# Patient Record
Sex: Male | Born: 1980 | Race: Black or African American | Hispanic: No | Marital: Single | Smoking: Never smoker
Health system: Southern US, Community
[De-identification: ages and names within clinical notes are randomized; demographics above are authoritative.]

## PROBLEM LIST (undated history)

## (undated) DIAGNOSIS — N200 Calculus of kidney: Secondary | ICD-10-CM

## (undated) HISTORY — PX: LITHOTRIPSY: SUR834

## (undated) HISTORY — PX: DENTAL SURGERY: SHX609

---

## 2007-11-18 ENCOUNTER — Ambulatory Visit: Payer: Self-pay | Admitting: Family Medicine

## 2007-11-18 LAB — CONVERTED CEMR LAB
ALT: 12 units/L (ref 0–53)
Albumin: 4.8 g/dL (ref 3.5–5.2)
BUN: 7 mg/dL (ref 6–23)
Basophils Absolute: 0 10*3/uL (ref 0.0–0.1)
Basophils Relative: 1 % (ref 0–1)
CO2: 24 meq/L (ref 19–32)
Eosinophils Absolute: 0.2 10*3/uL (ref 0.0–0.7)
Eosinophils Relative: 4 % (ref 0–5)
HCT: 41.4 % (ref 39.0–52.0)
HDL: 69 mg/dL (ref >39)
Hemoglobin: 13.6 g/dL (ref 13.0–17.0)
LDL Cholesterol: 63 mg/dL (ref 0–99)
Lymphocytes Relative: 38 % (ref 12–46)
Lymphs Abs: 1.6 10*3/uL (ref 0.7–4.0)
MCV: 79.9 fL (ref 78.0–100.0)
Monocytes Absolute: 0.3 10*3/uL (ref 0.1–1.0)
Monocytes Relative: 7 % (ref 3–12)
Platelets: 270 10*3/uL (ref 150–400)
Potassium: 4.3 meq/L (ref 3.5–5.3)
RDW: 13.7 % (ref 11.5–15.5)
Total CHOL/HDL Ratio: 2.1
Total Protein: 7.7 g/dL (ref 6.0–8.3)

## 2009-09-11 ENCOUNTER — Emergency Department (HOSPITAL_COMMUNITY): Admission: EM | Admit: 2009-09-11 | Discharge: 2009-09-11 | Payer: Self-pay | Admitting: Emergency Medicine

## 2009-09-28 ENCOUNTER — Emergency Department (HOSPITAL_COMMUNITY): Admission: EM | Admit: 2009-09-28 | Discharge: 2009-09-29 | Payer: Self-pay | Admitting: Emergency Medicine

## 2009-10-01 ENCOUNTER — Emergency Department (HOSPITAL_COMMUNITY): Admission: EM | Admit: 2009-10-01 | Discharge: 2009-10-01 | Payer: Self-pay | Admitting: Emergency Medicine

## 2009-10-11 ENCOUNTER — Ambulatory Visit: Payer: Self-pay | Admitting: Family Medicine

## 2010-06-30 LAB — COMPREHENSIVE METABOLIC PANEL
AST: 22 U/L (ref 0–37)
Calcium: 9.6 mg/dL (ref 8.4–10.5)
Chloride: 99 mEq/L (ref 96–112)
GFR calc Af Amer: >60 mL/min (ref >60)
Total Bilirubin: 0.9 mg/dL (ref 0.3–1.2)
Total Protein: 8.1 g/dL (ref 6.0–8.3)

## 2010-06-30 LAB — URINALYSIS, ROUTINE W REFLEX MICROSCOPIC
Glucose, UA: NEGATIVE mg/dL
Ketones, ur: NEGATIVE mg/dL
pH: 7 (ref 5.0–8.0)

## 2010-06-30 LAB — DIFFERENTIAL
Basophils Absolute: 0 10*3/uL (ref 0.0–0.1)
Basophils Relative: 1 % (ref 0–1)
Monocytes Absolute: 0.7 10*3/uL (ref 0.1–1.0)
Monocytes Relative: 7 % (ref 3–12)
Neutro Abs: 7.6 10*3/uL (ref 1.7–7.7)

## 2010-06-30 LAB — CBC
HCT: 41.1 % (ref 39.0–52.0)
MCV: 81.2 fL (ref 78.0–100.0)
RBC: 5.06 MIL/uL (ref 4.22–5.81)
RDW: 13.2 % (ref 11.5–15.5)
WBC: 9.4 10*3/uL (ref 4.0–10.5)

## 2010-06-30 LAB — RAPID STREP SCREEN (MED CTR MEBANE ONLY)
Streptococcus, Group A Screen (Direct): NEGATIVE
Streptococcus, Group A Screen (Direct): NEGATIVE

## 2011-02-13 IMAGING — CR DG CHEST 2V
2 series · 2 of 2 positions shown · non-contrast
Comparison: None

CLINICAL DATA: Fever; recent strep throat.

CHEST - 2 VIEW

[w chest pa]
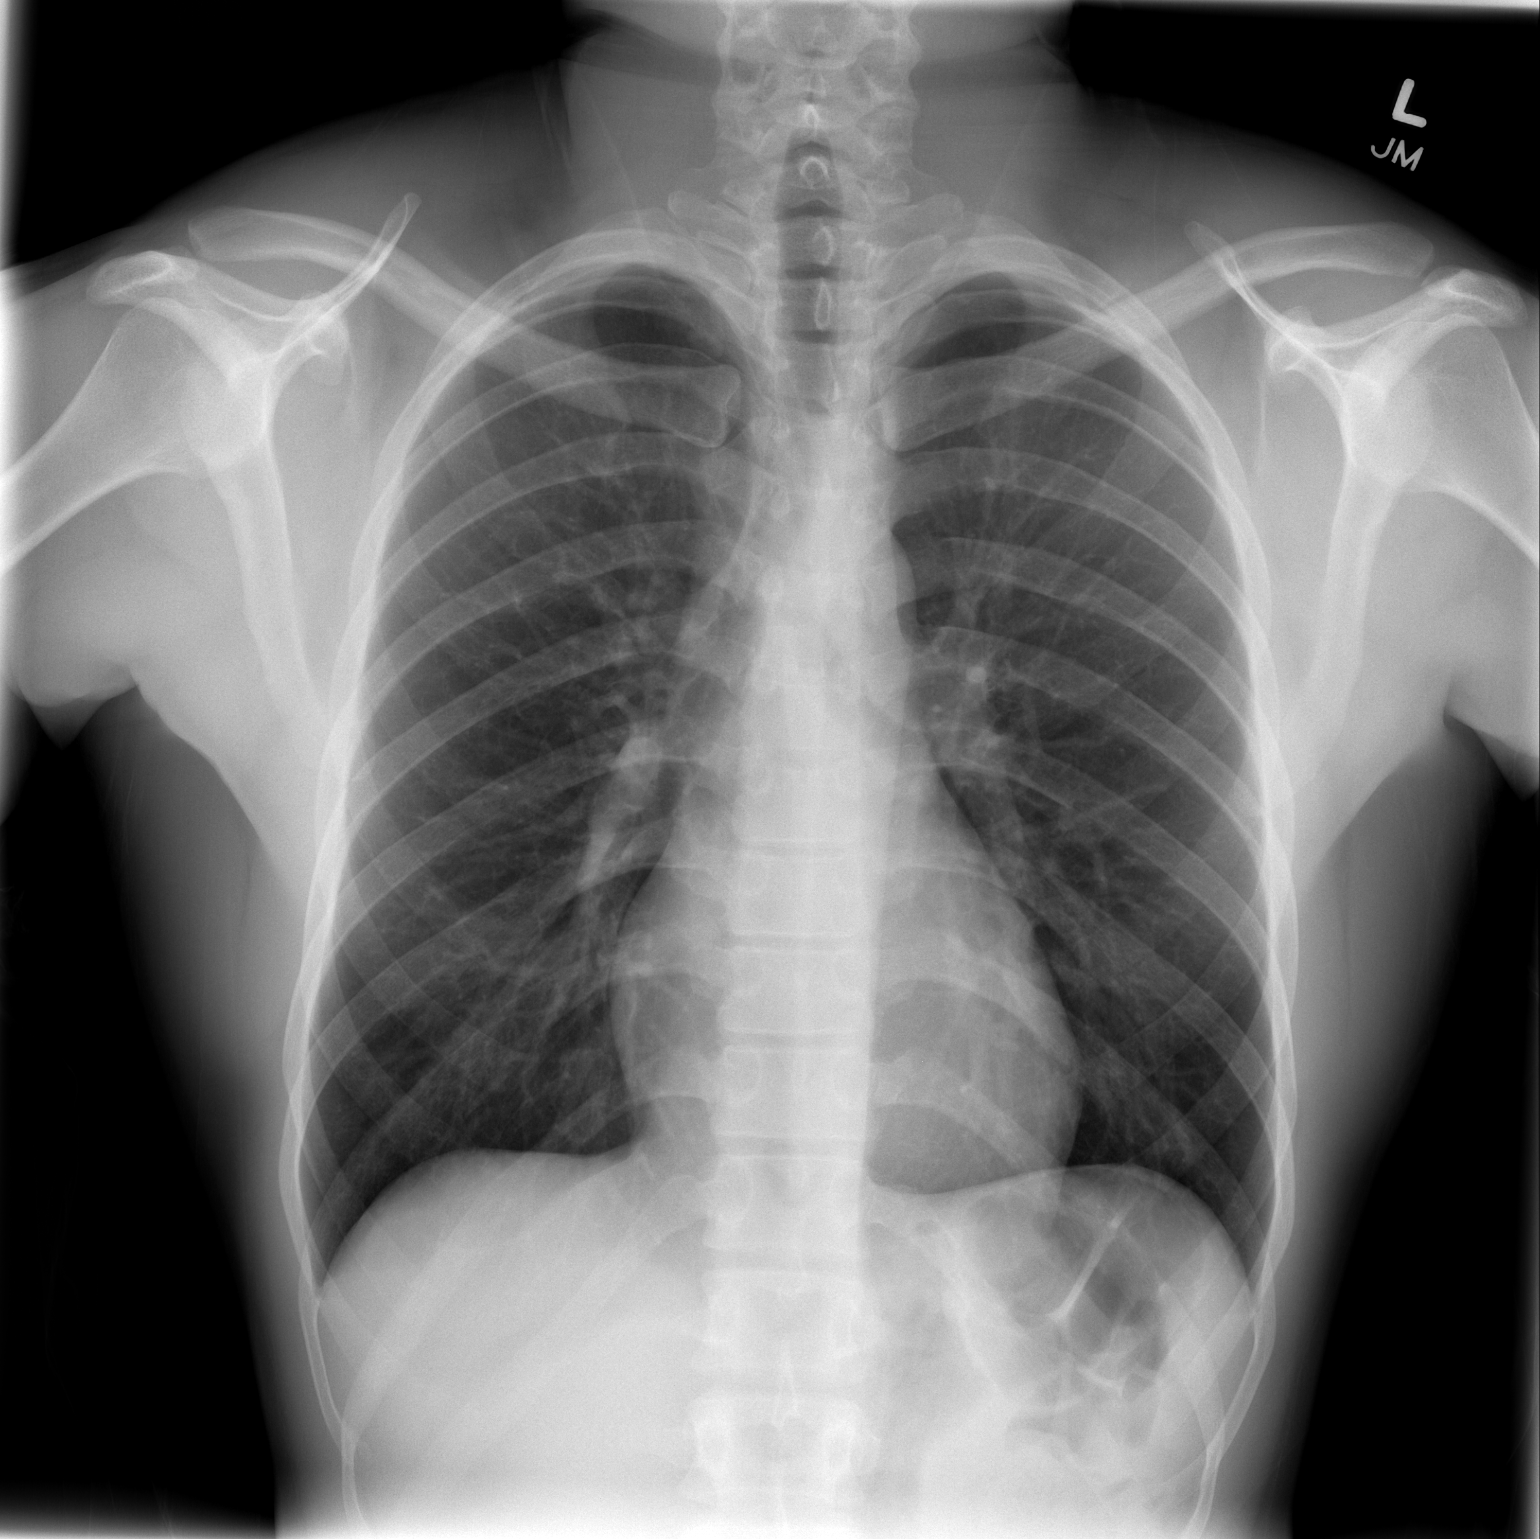

[w chest lat]
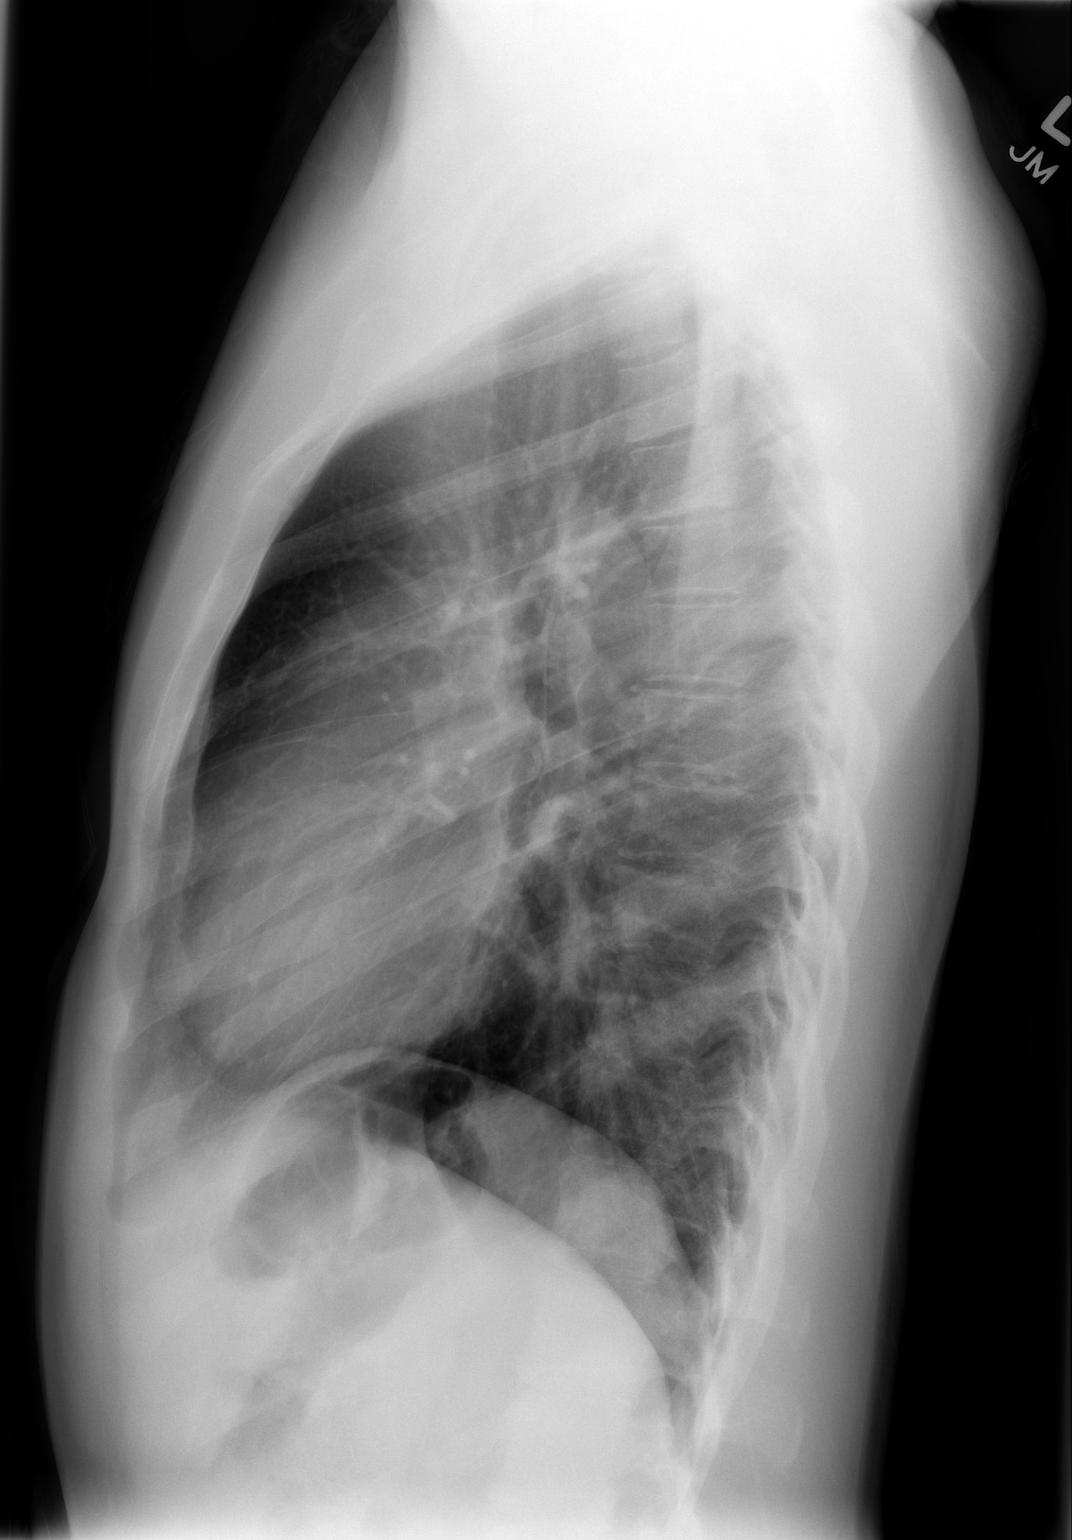

[2 of 2 positions shown; findings below may reference images not displayed]

FINDINGS: The lungs are well-aerated and clear.  There is no
evidence of focal opacification, pleural effusion or pneumothorax.

The heart is normal in size; the mediastinal contour is within
normal limits.  No acute osseous abnormalities are seen.
IMPRESSION: No acute cardiopulmonary process seen.

## 2011-04-03 ENCOUNTER — Ambulatory Visit: Payer: Self-pay

## 2011-05-18 ENCOUNTER — Emergency Department (HOSPITAL_COMMUNITY)
Admission: EM | Admit: 2011-05-18 | Discharge: 2011-05-18 | Disposition: A | Discharge status: Home / Self Care | Payer: Self-pay | Source: Home / Self Care | Attending: Family Medicine | Admitting: Family Medicine

## 2011-05-18 ENCOUNTER — Encounter (HOSPITAL_COMMUNITY): Payer: Self-pay

## 2011-05-18 DIAGNOSIS — M25579 Pain in unspecified ankle and joints of unspecified foot: Secondary | ICD-10-CM

## 2011-05-18 DIAGNOSIS — M25572 Pain in left ankle and joints of left foot: Secondary | ICD-10-CM

## 2011-05-18 NOTE — ED Provider Notes (Signed)
History     CSN: [id number]  Arrival date & time 05/18/11  1058   First MD Initiated Contact with Patient 05/18/11 1106      Chief Complaint  Patient presents with  . Foot Pain    (Consider location/radiation/quality/duration/timing/severity/associated sxs/prior treatment) HPI Comments: Alexander Ayala presents for evaluation of left ankle pain. He states that the pain started last Monday while he was at work. He denies any specific injury. 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 Reports worsening pain throughout the week. He does continue to walk on it and work on it.  Patient is a 31 y.o. male presenting with ankle pain. The history is provided by the patient.  Ankle Pain  The incident occurred more than 2 days ago. The incident occurred at work. There was no injury mechanism. The pain is present in the left ankle. The quality of the pain is described as aching. The pain is mild. Pertinent negatives include no numbness, no inability to bear weight and no tingling. The symptoms are aggravated by activity and bearing weight.    History reviewed. No pertinent past medical history.  History reviewed. No pertinent past surgical history.  History reviewed. No pertinent family history.  History  Substance Use Topics  . Smoking status: Never Smoker   . Smokeless tobacco: Not on file  . Alcohol Use: No      Review of Systems  Constitutional: Negative.   HENT: Negative.   Eyes: Negative.   Respiratory: Negative.   Cardiovascular: Negative.   Gastrointestinal: Negative.   Genitourinary: Negative.   Musculoskeletal: Positive for myalgias and arthralgias.       LEFT ankle pain  Skin: Negative.   Neurological: Negative.  Negative for tingling and numbness.    Allergies  Review of patient's allergies indicates no known allergies.  Home Medications  No current outpatient prescriptions on file.  BP 148/86  Pulse 88  Temp 98.2 F (36.8 C)  Resp  18  Physical Exam  Nursing note and vitals reviewed. Constitutional: He is oriented to person, place, and time. He appears well-developed and well-nourished.  HENT:  Head: Normocephalic and atraumatic.  Eyes: EOM are normal.  Neck: Normal range of motion.  Pulmonary/Chest: Effort normal.  Musculoskeletal: Normal range of motion.       Left ankle: He exhibits normal range of motion, no swelling, no ecchymosis and no deformity. no tenderness. Achilles tendon normal.  Neurological: He is alert and oriented to person, place, and time.  Skin: Skin is warm and dry.  Psychiatric: His behavior is normal.    ED Course  Procedures (including critical care time)  Labs Reviewed - No data to display No results found.   1. Ankle pain, left       MDM  Placed in ASO, given work note        Richardo Priest, MD 05/18/11 1204

## 2011-05-18 NOTE — ED Notes (Signed)
Pt states he has lt foot pain that started on Monday at work, no known injury and no edema noted.

## 2014-05-01 ENCOUNTER — Emergency Department (HOSPITAL_BASED_OUTPATIENT_CLINIC_OR_DEPARTMENT_OTHER): Payer: Self-pay

## 2014-05-01 ENCOUNTER — Emergency Department (HOSPITAL_BASED_OUTPATIENT_CLINIC_OR_DEPARTMENT_OTHER)
Admission: EM | Admit: 2014-05-01 | Discharge: 2014-05-01 | Disposition: A | Discharge status: Home / Self Care | Payer: Self-pay | Attending: Emergency Medicine | Admitting: Emergency Medicine

## 2014-05-01 ENCOUNTER — Encounter (HOSPITAL_BASED_OUTPATIENT_CLINIC_OR_DEPARTMENT_OTHER): Payer: Self-pay | Admitting: *Deleted

## 2014-05-01 DIAGNOSIS — N201 Calculus of ureter: Secondary | ICD-10-CM | POA: Insufficient documentation

## 2014-05-01 DIAGNOSIS — R52 Pain, unspecified: Secondary | ICD-10-CM

## 2014-05-01 LAB — BASIC METABOLIC PANEL
ANION GAP: 6 (ref 5–15)
BUN: 12 mg/dL (ref 6–23)
CALCIUM: 8.7 mg/dL (ref 8.4–10.5)
CO2: 25 mmol/L (ref 19–32)
Chloride: 100 mEq/L (ref 96–112)
Creatinine, Ser: 1.21 mg/dL (ref 0.50–1.35)
GFR calc Af Amer: >90 mL/min (ref >90)
GFR, EST NON AFRICAN AMERICAN: 78 mL/min — AB (ref >90)
GLUCOSE: 119 mg/dL — AB (ref 70–99)
POTASSIUM: 4.1 mmol/L (ref 3.5–5.1)
SODIUM: 131 mmol/L — AB (ref 135–145)

## 2014-05-01 LAB — CBC WITH DIFFERENTIAL/PLATELET
BASOS PCT: 0 % (ref 0–1)
Basophils Absolute: 0 10*3/uL (ref 0.0–0.1)
EOS ABS: 0 10*3/uL (ref 0.0–0.7)
EOS PCT: 0 % (ref 0–5)
HCT: 41.8 % (ref 39.0–52.0)
Hemoglobin: 14 g/dL (ref 13.0–17.0)
LYMPHS ABS: 0.9 10*3/uL (ref 0.7–4.0)
Lymphocytes Relative: 8 % — ABNORMAL LOW (ref 12–46)
MCH: 26.2 pg (ref 26.0–34.0)
MCHC: 33.5 g/dL (ref 30.0–36.0)
MCV: 78.1 fL (ref 78.0–100.0)
MONOS PCT: 7 % (ref 3–12)
Monocytes Absolute: 0.8 10*3/uL (ref 0.1–1.0)
NEUTROS PCT: 85 % — AB (ref 43–77)
Neutro Abs: 9.6 10*3/uL — ABNORMAL HIGH (ref 1.7–7.7)
Platelets: 270 10*3/uL (ref 150–400)
RBC: 5.35 MIL/uL (ref 4.22–5.81)
RDW: 13.6 % (ref 11.5–15.5)
WBC: 11.3 10*3/uL — ABNORMAL HIGH (ref 4.0–10.5)

## 2014-05-01 LAB — URINALYSIS, ROUTINE W REFLEX MICROSCOPIC
BILIRUBIN URINE: NEGATIVE
Glucose, UA: NEGATIVE mg/dL
KETONES UR: 40 mg/dL — AB
NITRITE: NEGATIVE
PH: 6 (ref 5.0–8.0)
PROTEIN: NEGATIVE mg/dL
SPECIFIC GRAVITY, URINE: 1.024 (ref 1.005–1.030)
UROBILINOGEN UA: 0.2 mg/dL (ref 0.0–1.0)

## 2014-05-01 LAB — URINE MICROSCOPIC-ADD ON

## 2014-05-01 MED ORDER — CIPROFLOXACIN HCL 500 MG PO TABS: 500.0000 mg | ORAL_TABLET | Freq: Two times a day (BID) | ORAL | Status: DC

## 2014-05-01 MED ORDER — SODIUM CHLORIDE 0.9 % IV BOLUS (SEPSIS)
1000.0000 mL | Freq: Once | INTRAVENOUS | Status: AC
  Administered 2014-05-01: 1000 mL via INTRAVENOUS

## 2014-05-01 MED ORDER — PROMETHAZINE HCL 25 MG PO TABS: 25.0000 mg | ORAL_TABLET | Freq: Four times a day (QID) | ORAL | Status: DC | PRN

## 2014-05-01 MED ORDER — TAMSULOSIN HCL 0.4 MG PO CAPS: 0.4000 mg | ORAL_CAPSULE | Freq: Every day | ORAL | Status: DC

## 2014-05-01 MED ORDER — TAMSULOSIN HCL 0.4 MG PO CAPS
0.4000 mg | ORAL_CAPSULE | Freq: Once | ORAL | Status: AC
  Administered 2014-05-01: 0.4 mg via ORAL
  Filled 2014-05-01: qty 1

## 2014-05-01 MED ORDER — HYDROMORPHONE HCL 4 MG PO TABS: 2.0000 mg | ORAL_TABLET | ORAL | Status: DC | PRN

## 2014-05-01 MED ORDER — ONDANSETRON HCL 4 MG/2ML IJ SOLN
4.0000 mg | Freq: Once | INTRAMUSCULAR | Status: AC
  Administered 2014-05-01: 4 mg via INTRAVENOUS
  Filled 2014-05-01: qty 2

## 2014-05-01 MED ORDER — CEFTRIAXONE SODIUM 1 G IJ SOLR
INTRAMUSCULAR | Status: AC
  Filled 2014-05-01: qty 10

## 2014-05-01 MED ORDER — FENTANYL CITRATE 0.05 MG/ML IJ SOLN
100.0000 ug | Freq: Once | INTRAMUSCULAR | Status: AC
  Administered 2014-05-01: 100 ug via INTRAVENOUS
  Filled 2014-05-01: qty 2

## 2014-05-01 MED ORDER — FENTANYL CITRATE 0.05 MG/ML IJ SOLN
100.0000 ug | Freq: Once | INTRAMUSCULAR | Status: AC | PRN
  Administered 2014-05-01: 100 ug via INTRAVENOUS
  Filled 2014-05-01: qty 2

## 2014-05-01 MED ORDER — DEXTROSE 5 % IV SOLN
1.0000 g | Freq: Once | INTRAVENOUS | Status: AC
  Administered 2014-05-01: 1 g via INTRAVENOUS

## 2014-05-01 NOTE — ED Notes (Signed)
C/o RLQ pain, also radiates around to R lower back, chills and nv, (denies: diarrhea, fever, bleeding), onset Friday, "think it might be a kidney stone, no h/o same", vomited x3 in last 24 hrs, last emesis 2 hrs ago, last ate 0900, last BM Sunday morning (normal), rates pain 5/10. took acetaminophen 1000 mg at 1910. Alert, NAD, calm, interactive, steady gait, family with pt.

## 2014-05-01 NOTE — ED Provider Notes (Signed)
CSN: [id number]     Arrival date & time 05/01/14  0108 History   First MD Initiated Contact with Patient 05/01/14 0130     Chief Complaint  Patient presents with  . Abdominal Pain     (Consider location/radiation/quality/duration/timing/severity/associated sxs/prior Treatment) HPI  This is a 34 year old male with a three-day history of abdominal pain. The pain started in his right lower back but has migrated to his right lower quadrant. It is not significantly affected by movement. He has had associated chills, nausea and vomiting. He denies fever, diarrhea, dysuria, hematuria or difficulty urinating. He rates his pain as a 5 out of 10 at the present time. The pain has been waxing and waning since its onset, worse this morning.  History reviewed. No pertinent past medical history. History reviewed. No pertinent past surgical history. No family history on file. History  Substance Use Topics  . Smoking status: Never Smoker   . Smokeless tobacco: Not on file  . Alcohol Use: No    Review of Systems  All other systems reviewed and are negative.   Allergies  Review of patient's allergies indicates no known allergies.  Home Medications   Prior to Admission medications   Not on File   BP 139/77 mmHg  Pulse 83  Temp(Src) 99.3 F (37.4 C) (Oral)  Resp 18  Ht  (1.778 m)  Wt 150 lb (68.04 kg)  BMI 21.52 kg/m2  SpO2 100%   Physical Exam  General: Well-developed, well-nourished male in no acute distress; appearance consistent with age of record HENT: normocephalic; atraumatic Eyes: pupils equal, round and reactive to light; extraocular muscles intact Neck: supple Heart: regular rate and rhythm Lungs: clear to auscultation bilaterally Abdomen: soft; nondistended; mild suprapubic tenderness; no masses or hepatosplenomegaly; bowel sounds present Extremities: No deformity; full range of motion; pulses normal Neurologic: Awake, alert and oriented; motor function intact in all  extremities and symmetric; no facial droop Skin: Warm and dry Psychiatric: Normal mood and affect    ED Course  Procedures (including critical care time)   MDM  Nursing notes and vitals signs, including pulse oximetry, reviewed.  Summary of this visit's results, reviewed by myself:  Labs:  Results for orders placed or performed during the hospital encounter of 05/01/14 (from the past 24 hour(s))  Urinalysis, Routine w reflex microscopic     Status: Abnormal   Collection Time: 05/01/14  1:45 AM  Result Value Ref Range   Color, Urine YELLOW YELLOW   APPearance CLEAR CLEAR   Specific Gravity, Urine 1.024 1.005 - 1.030   pH 6.0 5.0 - 8.0   Glucose, UA NEGATIVE NEGATIVE mg/dL   Hgb urine dipstick LARGE (A) NEGATIVE   Bilirubin Urine NEGATIVE NEGATIVE   Ketones, ur 40 (A) NEGATIVE mg/dL   Protein, ur NEGATIVE NEGATIVE mg/dL   Urobilinogen, UA 0.2 0.0 - 1.0 mg/dL   Nitrite NEGATIVE NEGATIVE   Leukocytes, UA SMALL (A) NEGATIVE  Basic metabolic panel     Status: Abnormal   Collection Time: 05/01/14  1:45 AM  Result Value Ref Range   Sodium 131 (L) 135 - 145 mmol/L   Potassium 4.1 3.5 - 5.1 mmol/L   Chloride 100 96 - 112 mEq/L   CO2 25 19 - 32 mmol/L   Glucose, Bld 119 (H) 70 - 99 mg/dL   BUN 12 6 - 23 mg/dL   Creatinine, Ser 4.09 0.50 - 1.35 mg/dL   Calcium 8.7 8.4 - 81.1 mg/dL   GFR calc non Af  Amer 78 (L) >90 mL/min   GFR calc Af Amer >90 >90 mL/min   Anion gap 6 5 - 15  CBC with Differential     Status: Abnormal   Collection Time: 05/01/14  1:45 AM  Result Value Ref Range   WBC 11.3 (H) 4.0 - 10.5 K/uL   RBC 5.35 4.22 - 5.81 MIL/uL   Hemoglobin 14.0 13.0 - 17.0 g/dL   HCT 04.541.8 40.939.0 - 81.152.0 %   MCV 78.1 78.0 - 100.0 fL   MCH 26.2 26.0 - 34.0 pg   MCHC 33.5 30.0 - 36.0 g/dL   RDW 91.413.6 78.211.5 - 95.615.5 %   Platelets 270 150 - 400 K/uL   Neutrophils Relative % 85 (H) 43 - 77 %   Neutro Abs 9.6 (H) 1.7 - 7.7 K/uL   Lymphocytes Relative 8 (L) 12 - 46 %   Lymphs Abs 0.9 0.7  - 4.0 K/uL   Monocytes Relative 7 3 - 12 %   Monocytes Absolute 0.8 0.1 - 1.0 K/uL   Eosinophils Relative 0 0 - 5 %   Eosinophils Absolute 0.0 0.0 - 0.7 K/uL   Basophils Relative 0 0 - 1 %   Basophils Absolute 0.0 0.0 - 0.1 K/uL  Urine microscopic-add on     Status: None   Collection Time: 05/01/14  1:45 AM  Result Value Ref Range   Squamous Epithelial / LPF RARE RARE   WBC, UA 7-10 <3 WBC/hpf   RBC / HPF 21-50 <3 RBC/hpf   Bacteria, UA RARE RARE    Imaging Studies: Ct Renal Stone Study  05/01/2014   CLINICAL DATA:  Right lower quadrant pain.  EXAM: CT ABDOMEN AND PELVIS WITHOUT CONTRAST  TECHNIQUE: Multidetector CT imaging of the abdomen and pelvis was performed following the standard protocol without IV contrast.  COMPARISON:  None.  FINDINGS: BODY WALL: Unremarkable.  LOWER CHEST: Unremarkable.  ABDOMEN/PELVIS:  Liver: No focal abnormality.  Biliary: No evidence of biliary obstruction or stone.  Pancreas: Unremarkable.  Spleen: Unremarkable.  Adrenals: Unremarkable.  Kidneys and ureters: Mild right hydroureteronephrosis secondary to a 11 x 7 mm stone in the upper ureter. Edema around the right kidney and upper ureter is also considered obstructive. There is extensive bilateral nephrolithiasis, with stones clustered in the lower poles. These calculi measure up to 5 mm. No left hydronephrosis or ureteral calculus.  Bladder: Unremarkable.  Reproductive: Unremarkable.  Bowel: No obstruction. Normal appendix.  Retroperitoneum: No mass or adenopathy.  Peritoneum: No ascites or pneumoperitoneum.  Vascular: No acute abnormality.  OSSEOUS: No acute abnormalities.  IMPRESSION: 1. Obstructing 11 x 7 mm stone in the upper right ureter. 2. Bilateral nephrolithiasis.   Electronically Signed   By: Tiburcio PeaJonathan  Watts M.D.   On: 05/01/2014 02:59   3:45 AM Discussed with Dr. Mena GoesEskridge of urology. He advises antibiotics, pain medicatin and prompt follow-up in the office.  Alexander SeamenJohn L Decarlos Empey, MD 05/01/14 [phone]

## 2014-05-01 NOTE — ED Notes (Signed)
Pt up to b/r, steady gait, denies nausea, c/o mild-moderate pain.

## 2014-05-02 ENCOUNTER — Other Ambulatory Visit: Payer: Self-pay | Admitting: Urology

## 2014-05-02 ENCOUNTER — Encounter (HOSPITAL_COMMUNITY): Payer: Self-pay | Admitting: *Deleted

## 2014-05-02 LAB — URINE CULTURE
COLONY COUNT: NO GROWTH
Culture: NO GROWTH

## 2014-05-04 ENCOUNTER — Encounter (HOSPITAL_COMMUNITY)
Admission: RE | Disposition: A | Discharge status: Home / Self Care | Payer: Self-pay | Source: Ambulatory Visit | Attending: Urology

## 2014-05-04 ENCOUNTER — Ambulatory Visit (HOSPITAL_COMMUNITY): Payer: Self-pay

## 2014-05-04 ENCOUNTER — Ambulatory Visit (HOSPITAL_COMMUNITY)
Admission: RE | Admit: 2014-05-04 | Discharge: 2014-05-04 | Disposition: A | Discharge status: Home / Self Care | Payer: Self-pay | Source: Ambulatory Visit | Attending: Urology | Admitting: Urology

## 2014-05-04 ENCOUNTER — Encounter (HOSPITAL_COMMUNITY): Payer: Self-pay | Admitting: *Deleted

## 2014-05-04 DIAGNOSIS — N201 Calculus of ureter: Secondary | ICD-10-CM | POA: Insufficient documentation

## 2014-05-04 HISTORY — DX: Calculus of kidney: N20.0

## 2014-05-04 SURGERY — LITHOTRIPSY, ESWL
Anesthesia: LOCAL | Laterality: Right

## 2014-05-04 MED ORDER — DIPHENHYDRAMINE HCL 25 MG PO CAPS
25.0000 mg | ORAL_CAPSULE | ORAL | Status: AC
  Administered 2014-05-04: 25 mg via ORAL
  Filled 2014-05-04: qty 1

## 2014-05-04 MED ORDER — SODIUM CHLORIDE 0.9 % IV SOLN
INTRAVENOUS | Status: DC
  Administered 2014-05-04: 17:00:00 via INTRAVENOUS

## 2014-05-04 MED ORDER — DIAZEPAM 5 MG PO TABS
10.0000 mg | ORAL_TABLET | ORAL | Status: AC
  Administered 2014-05-04: 10 mg via ORAL
  Filled 2014-05-04: qty 2

## 2014-05-04 MED ORDER — CIPROFLOXACIN HCL 500 MG PO TABS
500.0000 mg | ORAL_TABLET | ORAL | Status: AC
  Administered 2014-05-04: 500 mg via ORAL
  Filled 2014-05-04: qty 1

## 2014-05-04 NOTE — Discharge Instructions (Signed)
See [facility] discharge instructions in chart.  

## 2014-05-04 NOTE — Op Note (Signed)
See [facility] note scanned into chart. 

## 2014-05-04 NOTE — H&P (Signed)
Reason For Visit Right mid ureteral stone   History of Present Illness 34 year old male who presents in follow-up from the emergency department where he presented with acute onset right renal colic. In the emergency department he underwent a CT scan, stone protocol, that revealed a 10 mm proximal right ureteral stone. He was treated with medical expulsion therapy including Flomax, Zofran, and Dilaudid. The patient today has some ongoing right renal colic, but his pain is much better controlled. He denies any fevers or chills. He denies any voiding symptoms including dysuria or gross hematuria. The patient's nausea has improved, he is able to tolerate food without emesis at this point. The patient has never passed any kidney stones before he has no other past medical history.   Past Medical History Problems  1. History of No significant past medical history  Surgical History Problems  1. History of No Surgical Problems  Current Meds 1. Cipro 500 MG Oral Tablet (Ciprofloxacin HCl);  Therapy: (Recorded:19Jan2016) to Recorded 2. Flomax 0.4 MG Oral Capsule (Tamsulosin HCl);  Therapy: (Recorded:19Jan2016) to Recorded 3. HYDROmorphone HCl - 4 MG Oral Tablet;  Therapy: (Recorded:19Jan2016) to Recorded  Allergies Medication  1. No Known Drug Allergies  Family History Problems  1. No pertinent family history : Father  Social History Problems    Denied: History of Alcohol use   Caffeine use (F15.90)   1   Never a smoker   Occupation   Marine scientist   Single  Review of Systems Genitourinary, constitutional, skin, eye, otolaryngeal, hematologic/lymphatic, cardiovascular, pulmonary, endocrine, musculoskeletal, gastrointestinal, neurological and psychiatric system(s) were reviewed and pertinent findings if present are noted and are otherwise negative.  Genitourinary: urinary frequency, nocturia and urethral discharge.  Gastrointestinal: nausea and vomiting.   Constitutional: feeling tired (fatigue).  Musculoskeletal: back pain.    Vitals Vital Signs [Data Includes: Last 1 Day]  Recorded: 19Jan2016 11:01AM  Height: 5 ft 10 in Weight: 150 lb  BMI Calculated: 21.52 BSA Calculated: 1.85 Blood Pressure: 139 / 85 Temperature: 97.8 F Heart Rate: 66  Physical Exam Constitutional: Well nourished and well developed . No acute distress.  ENT:. The ears and nose are normal in appearance.  Neck: The appearance of the neck is normal and no neck mass is present.  Pulmonary: No respiratory distress and normal respiratory rhythm and effort.  Cardiovascular: Heart rate and rhythm are normal . No peripheral edema.  Abdomen: The abdomen is soft and nontender. No masses are palpated. No CVA tenderness. No hernias are palpable. No hepatosplenomegaly noted.  Rectal: The prostate exam was deferred.  Lymphatics: The femoral and inguinal nodes are not enlarged or tender.  Skin: Normal skin turgor, no visible rash and no visible skin lesions.  Neuro/Psych:. Mood and affect are appropriate.    Results/Data Urine [Data Includes: Last 1 Day]   19Jan2016  COLOR YELLOW   APPEARANCE CLEAR   SPECIFIC GRAVITY 1.015   pH 6.0   GLUCOSE NEG mg/dL  BILIRUBIN NEG   KETONE 15 mg/dL  BLOOD LARGE   PROTEIN NEG mg/dL  UROBILINOGEN 0.2 mg/dL  NITRITE NEG   LEUKOCYTE ESTERASE NEG   SQUAMOUS EPITHELIAL/HPF NONE SEEN   WBC NONE SEEN WBC/hpf  RBC 3-6 RBC/hpf  BACTERIA NONE SEEN   CRYSTALS NONE SEEN   CASTS NONE SEEN    The patient's urinalysis today reveals microscopic hematuria without evidence of infection.  Of independently reviewed the patient's CT scan dated 1/18/extending: This shows a large right proximal ureteral stone measuring approximately 10 mm. The  patient has bilateral nonobstructing stones as well.   Assessment Assessed  1. Right ureteral calculus (N20.1)  Right mid ureteral obstructing stone with associated renal colic, no evidence infection,  pain appears to be well managed.   Plan Health Maintenance  1. UA With REFLEX; [Do Not Release]; Status:Complete;   Done: 19Jan2016 10:42AM Right ureteral calculus  2. Follow-up Schedule Surgery Office  Follow-up  Status: Complete  Done: 19Jan2016  Discussion/Summary Plan the treatment options for this patient's mid ureteral stone. I did advise him that I thought this was likely too large to pass a reasonable time on his own. I subsequently are recommended intervention. We discussed both ureteroscopy and shockwave lithotripsy. I went over the risks and benefits of each. Ultimately, the patient has opted for shock wave lithotripsy. I did go over the risks including damage to the surrounding structures, perinephric hematoma, as well as need for additional procedures secondary to poor fragmentation. Having heard the risks the patient elected to proceed. We'll get this scheduled as soon as possible for him.

## 2015-04-06 ENCOUNTER — Ambulatory Visit (INDEPENDENT_AMBULATORY_CARE_PROVIDER_SITE_OTHER): Payer: Self-pay | Admitting: Emergency Medicine

## 2015-04-06 VITALS — BP 136/74 | HR 70 | Temp 98.0°F | Resp 14 | Ht 69.5 in | Wt 161.8 lb

## 2015-04-06 DIAGNOSIS — Z024 Encounter for examination for driving license: Secondary | ICD-10-CM

## 2015-04-06 DIAGNOSIS — Z021 Encounter for pre-employment examination: Secondary | ICD-10-CM

## 2015-04-06 NOTE — Progress Notes (Signed)
Subjective:  Patient ID: Alexander Ayala, male    DOB: 08/26/1980  Age: 34 y.o. MRN: [id number]  CC: Employment Physical   HPI Alexander Ayala presents   DOT physical  History Alexander Ayala has a past medical history of Kidney stones.   He has past surgical history that includes Dental surgery.   His  family history is not on file.  He   reports that he has never smoked. He has never used smokeless tobacco. He reports that he does not drink alcohol or use illicit drugs.  Outpatient Prescriptions Prior to Visit  Medication Sig Dispense Refill  . ciprofloxacin (CIPRO) 500 MG tablet Take 1 tablet (500 mg total) by mouth 2 (two) times daily. One po bid x 7 days (Patient not taking: Reported on 04/06/2015) 14 tablet 0  . HYDROmorphone (DILAUDID) 4 MG tablet Take 0.5-1 tablets (2-4 mg total) by mouth every 4 (four) hours as needed for severe pain. (Patient not taking: Reported on 04/06/2015) 20 tablet 0  . promethazine (PHENERGAN) 25 MG tablet Take 1 tablet (25 mg total) by mouth every 6 (six) hours as needed for nausea or vomiting. (Patient not taking: Reported on 04/06/2015) 20 tablet 0  . tamsulosin (FLOMAX) 0.4 MG CAPS capsule Take 1 capsule (0.4 mg total) by mouth daily. (Patient not taking: Reported on 04/06/2015) 15 capsule 0   No facility-administered medications prior to visit.    Social History   Social History  . Marital Status: Single    Spouse Name: N/A  . Number of Children: N/A  . Years of Education: N/A   Social History Main Topics  . Smoking status: Never Smoker   . Smokeless tobacco: Never Used  . Alcohol Use: No  . Drug Use: No  . Sexual Activity: Yes   Other Topics Concern  . None   Social History Narrative     Review of Systems  Constitutional: Negative for fever, chills and appetite change.  HENT: Negative for congestion, ear pain, postnasal drip, sinus pressure and sore throat.   Eyes: Negative for pain and redness.  Respiratory: Negative for cough,  shortness of breath and wheezing.   Cardiovascular: Negative for leg swelling.  Gastrointestinal: Negative for nausea, vomiting, abdominal pain, diarrhea, constipation and blood in stool.  Endocrine: Negative for polyuria.  Genitourinary: Negative for dysuria, urgency, frequency and flank pain.  Musculoskeletal: Negative for gait problem.  Skin: Negative for rash.  Neurological: Negative for weakness and headaches.  Psychiatric/Behavioral: Negative for confusion and decreased concentration. The patient is not nervous/anxious.     Objective:  BP 136/74 mmHg  Pulse 70  Temp(Src) 98 F (36.7 C) (Oral)  Resp 14  Ht 5' 9.5" (1.765 m)  Wt 161 lb 12.8 oz (73.392 kg)  BMI 23.56 kg/m2  SpO2 98%  Physical Exam  Constitutional: He is oriented to person, place, and time. He appears well-developed and well-nourished. No distress.  HENT:  Head: Normocephalic and atraumatic.  Right Ear: External ear normal.  Left Ear: External ear normal.  Nose: Nose normal.  Eyes: Conjunctivae and EOM are normal. Pupils are equal, round, and reactive to light. No scleral icterus.  Neck: Normal range of motion. Neck supple. No tracheal deviation present.  Cardiovascular: Normal rate, regular rhythm and normal heart sounds.   Pulmonary/Chest: Effort normal. No respiratory distress. He has no wheezes. He has no rales.  Abdominal: He exhibits no mass. There is no tenderness. There is no rebound and no guarding.  Musculoskeletal: He exhibits no edema.  Lymphadenopathy:    He has no cervical adenopathy.  Neurological: He is alert and oriented to person, place, and time. Coordination normal.  Skin: Skin is warm and dry. No rash noted.  Psychiatric: He has a normal mood and affect. His behavior is normal.      Assessment & Plan:   Alexander Ayala was seen today for employment physical.  Diagnoses and all orders for this visit:  Encounter for commercial driver medical examination (CDME)  I am having Alexander Ayala  maintain his ciprofloxacin, tamsulosin, HYDROmorphone, and promethazine.  No orders of the defined types were placed in this encounter.    Appropriate red flag conditions were discussed with the patient as well as actions that should be taken.  Patient expressed his understanding.  Follow-up: Return if symptoms worsen or fail to improve.  Carmelina Dane, MD

## 2016-03-26 ENCOUNTER — Ambulatory Visit (INDEPENDENT_AMBULATORY_CARE_PROVIDER_SITE_OTHER): Payer: Self-pay | Admitting: Physician Assistant

## 2016-03-26 ENCOUNTER — Ambulatory Visit (INDEPENDENT_AMBULATORY_CARE_PROVIDER_SITE_OTHER): Payer: Self-pay

## 2016-03-26 VITALS — BP 118/70 | HR 70 | Temp 98.4°F | Resp 18 | Ht 69.5 in | Wt 164.0 lb

## 2016-03-26 DIAGNOSIS — R319 Hematuria, unspecified: Secondary | ICD-10-CM

## 2016-03-26 DIAGNOSIS — N200 Calculus of kidney: Secondary | ICD-10-CM

## 2016-03-26 DIAGNOSIS — R110 Nausea: Secondary | ICD-10-CM

## 2016-03-26 LAB — POCT URINALYSIS DIP (MANUAL ENTRY)
GLUCOSE UA: NEGATIVE
Leukocytes, UA: NEGATIVE
NITRITE UA: NEGATIVE
Spec Grav, UA: >=1.03
UROBILINOGEN UA: 1
pH, UA: 6

## 2016-03-26 LAB — POC MICROSCOPIC URINALYSIS (UMFC): MUCUS RE: ABSENT

## 2016-03-26 NOTE — Progress Notes (Signed)
Patient ID: Alexander Ayala, male    DOB: 01/28/1981, 35 y.o.   MRN: [id number]  PCP: No primary care provider on file.  Chief Complaint  Patient presents with  . Abdominal Pain    SUNDAY  . Generalized Body Aches  . Nausea    Subjective:   Presents for evaluation of possible recurrent nephrolithiasis.  Developed abdominal pain on Sunday (03/23/2016) night. Monday, the pain had resolved, but had nausea and fatigue, body aches. Those symptoms lasted all day and yesterday, but are now resolved. He feels fine now, but wants to make sure he doesn't have a kidney stone.  Toilet overflowed the day prior to onset of symptoms. Wonders if he may have been exposed to a bacteria when cleaning up.  No urinary symptoms other than increased frequency, but notes he's been drinking more water because he didn't have much appetite. No hematuria. No diarrhea or constipation. No fever, chills. No spceific back pain.    Review of Systems  Constitutional: Positive for appetite change and fatigue. Negative for chills, diaphoresis and fever.  HENT: Negative for congestion, ear pain, rhinorrhea, sinus pressure and sore throat.   Eyes: Negative for pain and visual disturbance.  Respiratory: Negative for cough and shortness of breath.   Cardiovascular: Negative for chest pain, palpitations and leg swelling.  Gastrointestinal: Positive for abdominal pain and nausea. Negative for abdominal distention, anal bleeding, blood in stool, constipation, diarrhea, rectal pain and vomiting.  Endocrine: Negative.   Genitourinary: Positive for frequency. Negative for difficulty urinating, dysuria, flank pain, hematuria and urgency.  Musculoskeletal: Positive for myalgias (primarily in the neck and arms). Negative for arthralgias, back pain, gait problem, joint swelling, neck pain and neck stiffness.  Allergic/Immunologic: Negative.   Neurological: Negative for dizziness, weakness and headaches.  Hematological:  Negative.        There are no active problems to display for this patient.    Prior to Admission medications   Not on File     No Known Allergies     Objective:  Physical Exam  Constitutional: He is oriented to person, place, and time. He appears well-developed and well-nourished. He is active and cooperative. No distress.  BP 118/70 (BP Location: Right Arm, Patient Position: Sitting, Cuff Size: Small)   Pulse 70   Temp 98.4 F (36.9 C) (Oral)   Resp 18   Ht 5' 9.5" (1.765 m)   Wt 164 lb (74.4 kg)   SpO2 99%   BMI 23.87 kg/m   HENT:  Head: Normocephalic and atraumatic.  Right Ear: Hearing normal.  Left Ear: Hearing normal.  Eyes: Conjunctivae are normal. No scleral icterus.  Neck: Normal range of motion. Neck supple. No thyromegaly present.  Cardiovascular: Normal rate, regular rhythm and normal heart sounds.   Pulses:      Radial pulses are 2+ on the right side, and 2+ on the left side.  Pulmonary/Chest: Effort normal and breath sounds normal.  Abdominal: Normal appearance and bowel sounds are normal. There is no hepatosplenomegaly. There is no tenderness. There is no CVA tenderness.  Musculoskeletal:       Thoracic back: Normal.       Lumbar back: Normal.  Lymphadenopathy:       Head (right side): No tonsillar, no preauricular, no posterior auricular and no occipital adenopathy present.       Head (left side): No tonsillar, no preauricular, no posterior auricular and no occipital adenopathy present.    He has no cervical adenopathy.  Right: No supraclavicular adenopathy present.       Left: No supraclavicular adenopathy present.  Neurological: He is alert and oriented to person, place, and time. No sensory deficit.  Skin: Skin is warm, dry and intact. No rash noted. No cyanosis or erythema. Nails show no clubbing.  Psychiatric: He has a normal mood and affect. His speech is normal and behavior is normal.      Results for orders placed or performed in  visit on 03/26/16  POCT urinalysis dipstick  Result Value Ref Range   Color, UA yellow yellow   Clarity, UA clear clear   Glucose, UA negative negative   Bilirubin, UA small (A) negative   Ketones, POC UA moderate (40) (A) negative   Spec Grav, UA >=1.030    Blood, UA trace-lysed (A) negative   pH, UA 6.0    Protein Ur, POC =100 (A) negative   Urobilinogen, UA 1.0    Nitrite, UA Negative Negative   Leukocytes, UA Negative Negative  POCT Microscopic Urinalysis ([facility])  Result Value Ref Range   WBC,UR,HPF,POC None None WBC/hpf   RBC,UR,HPF,POC Moderate (A) None RBC/hpf   Bacteria None None, Too numerous to count   Mucus Absent Absent   Epithelial Cells, UR Per Microscopy None None, Too numerous to count cells/hpf    Dg Abd 2 Views  Result Date: 03/26/2016 CLINICAL DATA:  Hematuria.  Evaluate for nephrolithiasis. EXAM: ABDOMEN - 2 VIEW COMPARISON:  None. FINDINGS: Bowel gas overlying the renal shadows but there are small calcifications in the region of the renal lower poles. These could represent small stones, largest measuring 7 mm on the right side. No definite stones along the expected course of the ureters or bladder. Normal bowel gas pattern. Lung bases are clear. No acute bone abnormality. IMPRESSION: Limited evaluation for kidney stones due to overlying bowel gas. However, there is concern for small bilateral kidney stones. Electronically Signed   By: Richarda OverlieAdam  Henn M.D.   On: 03/26/2016 11:37       Assessment & Plan:   1. Nausea without vomiting Resolved. - POCT urinalysis dipstick - POCT Microscopic Urinalysis ([facility])  2. Hematuria, unspecified type Due to #3. - DG Abd 2 Views; Future - Urine culture  3. Nephrolithiasis Likely he had a stone in the ureter that caused his symptoms that has dropped into the bladder allowing for symptom resolution. Continue to hydrate. Refer back to urology for discussion of anticipated course and recommendations going forward. RTC here  sooner if symptoms recur. - Ambulatory referral to Urology   Fernande Brashelle S. Lamekia Nolden, PA-C Physician Assistant-Certified [facility]

## 2016-03-26 NOTE — Patient Instructions (Addendum)
Continue to hydrate well. If you develop urinary burning, fever/chills, recurrent nausea, abdominal pain or back pain before you see the urologist, please return here.    IF you received an x-ray today, you will receive an invoice from [facility]. Please contact [facility] at [phone] with questions or concerns regarding your invoice.   IF you received labwork today, you will receive an invoice from [facility]. Please contact [facility] at [phone] with questions or concerns regarding your invoice.   Our billing staff will not be able to assist you with questions regarding bills from these companies.  You will be contacted with the lab results as soon as they are available. The fastest way to get your results is to activate your My Chart account. Instructions are located on the last page of this paperwork. If you have not heard from us regarding the results in 2 weeks, please contact this office.

## 2016-03-28 LAB — URINE CULTURE: Organism ID, Bacteria: NO GROWTH

## 2018-05-22 ENCOUNTER — Encounter (HOSPITAL_COMMUNITY): Payer: Self-pay

## 2018-05-22 ENCOUNTER — Emergency Department (HOSPITAL_COMMUNITY): Payer: Self-pay

## 2018-05-22 ENCOUNTER — Emergency Department (HOSPITAL_COMMUNITY)
Admission: EM | Admit: 2018-05-22 | Discharge: 2018-05-22 | Disposition: A | Discharge status: Home / Self Care | Payer: Self-pay | Attending: Emergency Medicine | Admitting: Emergency Medicine

## 2018-05-22 ENCOUNTER — Other Ambulatory Visit: Payer: Self-pay

## 2018-05-22 ENCOUNTER — Encounter (HOSPITAL_COMMUNITY): Payer: Self-pay | Admitting: Emergency Medicine

## 2018-05-22 ENCOUNTER — Ambulatory Visit (HOSPITAL_COMMUNITY)
Admission: EM | Admit: 2018-05-22 | Discharge: 2018-05-22 | Disposition: A | Discharge status: Home / Self Care | Payer: Self-pay | Attending: Internal Medicine | Admitting: Internal Medicine

## 2018-05-22 DIAGNOSIS — N179 Acute kidney failure, unspecified: Secondary | ICD-10-CM

## 2018-05-22 DIAGNOSIS — N23 Unspecified renal colic: Secondary | ICD-10-CM | POA: Insufficient documentation

## 2018-05-22 DIAGNOSIS — N3001 Acute cystitis with hematuria: Secondary | ICD-10-CM | POA: Insufficient documentation

## 2018-05-22 DIAGNOSIS — N200 Calculus of kidney: Secondary | ICD-10-CM

## 2018-05-22 LAB — CBC WITH DIFFERENTIAL/PLATELET
ABS IMMATURE GRANULOCYTES: 0.02 10*3/uL (ref 0.00–0.07)
BASOS ABS: 0 10*3/uL (ref 0.0–0.1)
Basophils Relative: 0 %
Eosinophils Absolute: 0 10*3/uL (ref 0.0–0.5)
Eosinophils Relative: 0 %
HEMATOCRIT: 43.2 % (ref 39.0–52.0)
HEMOGLOBIN: 14.2 g/dL (ref 13.0–17.0)
Immature Granulocytes: 0 %
LYMPHS PCT: 15 %
Lymphs Abs: 1.5 10*3/uL (ref 0.7–4.0)
MCH: 25.8 pg — ABNORMAL LOW (ref 26.0–34.0)
MCHC: 32.9 g/dL (ref 30.0–36.0)
MCV: 78.5 fL — ABNORMAL LOW (ref 80.0–100.0)
MONO ABS: 0.8 10*3/uL (ref 0.1–1.0)
Monocytes Relative: 8 %
NEUTROS ABS: 7.6 10*3/uL (ref 1.7–7.7)
Neutrophils Relative %: 77 %
Platelets: 283 10*3/uL (ref 150–400)
RBC: 5.5 MIL/uL (ref 4.22–5.81)
RDW: 13.2 % (ref 11.5–15.5)
WBC: 9.9 10*3/uL (ref 4.0–10.5)
nRBC: 0 % (ref 0.0–0.2)

## 2018-05-22 LAB — COMPREHENSIVE METABOLIC PANEL
ALT: 32 U/L (ref 0–44)
AST: 35 U/L (ref 15–41)
Albumin: 4.3 g/dL (ref 3.5–5.0)
Alkaline Phosphatase: 46 U/L (ref 38–126)
Anion gap: 15 (ref 5–15)
BUN: 10 mg/dL (ref 6–20)
CO2: 22 mmol/L (ref 22–32)
CREATININE: 1.66 mg/dL — AB (ref 0.61–1.24)
Calcium: 9.4 mg/dL (ref 8.9–10.3)
Chloride: 96 mmol/L — ABNORMAL LOW (ref 98–111)
GFR, EST NON AFRICAN AMERICAN: 53 mL/min — AB (ref >60)
Glucose, Bld: 94 mg/dL (ref 70–99)
Potassium: 3.9 mmol/L (ref 3.5–5.1)
Sodium: 133 mmol/L — ABNORMAL LOW (ref 135–145)
Total Bilirubin: 1.1 mg/dL (ref 0.3–1.2)
Total Protein: 7.8 g/dL (ref 6.5–8.1)

## 2018-05-22 LAB — URINALYSIS, ROUTINE W REFLEX MICROSCOPIC
BILIRUBIN URINE: NEGATIVE
Glucose, UA: NEGATIVE mg/dL
Ketones, ur: 20 mg/dL — AB
NITRITE: NEGATIVE
Protein, ur: NEGATIVE mg/dL
RBC / HPF: >50 RBC/hpf — ABNORMAL HIGH (ref 0–5)
Specific Gravity, Urine: 1.003 — ABNORMAL LOW (ref 1.005–1.030)
pH: 6 (ref 5.0–8.0)

## 2018-05-22 MED ORDER — SODIUM CHLORIDE 0.9 % IV SOLN
1.0000 g | Freq: Once | INTRAVENOUS | Status: AC
  Administered 2018-05-22: 1 g via INTRAVENOUS
  Filled 2018-05-22: qty 10

## 2018-05-22 MED ORDER — SODIUM CHLORIDE 0.9 % IV BOLUS
1000.0000 mL | Freq: Once | INTRAVENOUS | Status: AC
  Administered 2018-05-22: 1000 mL via INTRAVENOUS

## 2018-05-22 MED ORDER — KETOROLAC TROMETHAMINE 30 MG/ML IJ SOLN
30.0000 mg | Freq: Once | INTRAMUSCULAR | Status: AC
  Administered 2018-05-22: 30 mg via INTRAVENOUS
  Filled 2018-05-22: qty 1

## 2018-05-22 MED ORDER — ONDANSETRON 4 MG PO TBDP: ORAL_TABLET | ORAL | 0 refills | Status: AC

## 2018-05-22 MED ORDER — CEPHALEXIN 500 MG PO CAPS: 500.0000 mg | ORAL_CAPSULE | Freq: Three times a day (TID) | ORAL | 0 refills | Status: DC

## 2018-05-22 MED ORDER — MORPHINE SULFATE (PF) 4 MG/ML IV SOLN: 4.0000 mg | Freq: Once | INTRAVENOUS | Status: DC

## 2018-05-22 MED ORDER — OXYCODONE-ACETAMINOPHEN 5-325 MG PO TABS: 1.0000 | ORAL_TABLET | Freq: Four times a day (QID) | ORAL | 0 refills | Status: AC | PRN

## 2018-05-22 NOTE — ED Notes (Signed)
Patient transported to CT 

## 2018-05-22 NOTE — ED Provider Notes (Signed)
[facility]    CSN: [id number] Arrival date & time: 05/22/18  1138     History   Chief Complaint Chief Complaint  Patient presents with  . Flank Pain    HPI Alexander Ayala is a 38 y.o. male.   38 year old male with history of kidney stones presents to urgent care complaining of left flank pain.  The patient reports grossly bloody urine yesterday.  He denies fevers.  He continues to have pain and nausea.     Past Medical History:  Diagnosis Date  . Kidney stones     Patient Active Problem List   Diagnosis Date Noted  . Nephrolithiasis 03/26/2016    Past Surgical History:  Procedure Laterality Date  . DENTAL SURGERY    . LITHOTRIPSY         Home Medications    Prior to Admission medications   Not on File    Family History Family History  Problem Relation Age of Onset  . Healthy Mother     Social History Social History   Tobacco Use  . Smoking status: Never Smoker  . Smokeless tobacco: Never Used  Substance Use Topics  . Alcohol use: No  . Drug use: No     Allergies   Patient has no known allergies.   Review of Systems Review of Systems  Constitutional: Negative for chills and fever.  HENT: Negative for sore throat and tinnitus.   Eyes: Negative for redness.  Respiratory: Negative for cough and shortness of breath.   Cardiovascular: Negative for chest pain and palpitations.  Gastrointestinal: Negative for abdominal pain, diarrhea, nausea and vomiting.  Genitourinary: Positive for flank pain and hematuria. Negative for dysuria, frequency and urgency.  Musculoskeletal: Negative for myalgias.  Skin: Negative for rash.       No lesions  Neurological: Negative for weakness.  Hematological: Does not bruise/bleed easily.  Psychiatric/Behavioral: Negative for suicidal ideas.     Physical Exam Triage Vital Signs ED Triage Vitals [05/22/18 1250]  Enc Vitals Group     BP (!) 154/88     Pulse Rate 75     Resp 18     Temp 98.7  F (37.1 C)     Temp Source Oral     SpO2 99 %     Weight      Height      Head Circumference      Peak Flow      Pain Score 6     Pain Loc      Pain Edu?      Excl. in GC?    No data found.  Updated Vital Signs BP (!) 154/88 (BP Location: Right Arm)   Pulse 75   Temp 98.7 F (37.1 C) (Oral)   Resp 18   SpO2 99%   Visual Acuity Right Eye Distance:   Left Eye Distance:   Bilateral Distance:    Right Eye Near:   Left Eye Near:    Bilateral Near:     Physical Exam Constitutional:      General: He is not in acute distress.    Appearance: He is well-developed.  HENT:     Head: Normocephalic and atraumatic.  Eyes:     General: No scleral icterus.    Conjunctiva/sclera: Conjunctivae normal.     Pupils: Pupils are equal, round, and reactive to light.  Neck:     Musculoskeletal: Normal range of motion and neck supple.     Thyroid: No thyromegaly.  Vascular: No JVD.     Trachea: No tracheal deviation.  Cardiovascular:     Rate and Rhythm: Normal rate and regular rhythm.     Heart sounds: Normal heart sounds. No murmur. No friction rub. No gallop.   Pulmonary:     Effort: Pulmonary effort is normal. No respiratory distress.     Breath sounds: Normal breath sounds.  Abdominal:     General: Bowel sounds are normal. There is no distension.     Palpations: Abdomen is soft.     Tenderness: There is no abdominal tenderness.  Musculoskeletal: Normal range of motion.  Lymphadenopathy:     Cervical: No cervical adenopathy.  Skin:    General: Skin is warm and dry.     Findings: No erythema or rash.  Neurological:     Mental Status: He is alert and oriented to person, place, and time.     Cranial Nerves: No cranial nerve deficit.  Psychiatric:        Behavior: Behavior normal.        Thought Content: Thought content normal.        Judgment: Judgment normal.      UC Treatments / Results  Labs (all labs ordered are listed, but only abnormal results are  displayed) Labs Reviewed - No data to display  EKG None  Radiology No results found.  Procedures Procedures (including critical care time)  Medications Ordered in UC Medications - No data to display  Initial Impression / Assessment and Plan / UC Course  I have reviewed the triage vital signs and the nursing notes.  Pertinent labs & imaging results that were available during my care of the patient were reviewed by me and considered in my medical decision making (see chart for details).     The patient's urine is cleared and is no longer grossly bloody but he continues to have colicky flank pain.  He denies fevers but admits to some nausea.  Recommended CT scan to evaluate presence and size of kidney stone to determine course of treatment.  Final Clinical Impressions(s) / UC Diagnoses   Final diagnoses:  Kidney stone   Discharge Instructions   None    ED Prescriptions    None     Controlled Substance Prescriptions [place] Controlled Substance Registry consulted? Not Applicable   Arnaldo Natal, MD 05/22/18 1310

## 2018-05-22 NOTE — ED Triage Notes (Signed)
Pt sent by urgent care for further evaluation of possible kidney stone; c/o L sided flank pain that began Thursday night; pt endorses hematuria and several episodes of vomiting yesterday; pt has hx of kidney stones; NAD at this time

## 2018-05-22 NOTE — ED Triage Notes (Signed)
Pt here for left sided flank pain with hx of kidney stone that feels like the same

## 2018-05-22 NOTE — ED Provider Notes (Signed)
[facility] Provider Note   CSN: [id number] Arrival date & time: 05/22/18  1310     History   Chief Complaint Chief Complaint  Patient presents with  . Flank Pain    HPI Alexander Ayala is a 38 y.o. male history of kidney stones here presenting with left flank pain.  Patient has left flank pain and hematuria for the last 2 days.  States that the pain is sharp and radiates to the left groin.  Also had some episodes of vomiting yesterday.  Patient went to urgent care and sent here for CT abdomen pelvis for further evaluation.  Of note, patient had lithotripsy several years ago by Dr. Marlou PorchHerrick.  The history is provided by the patient.    Past Medical History:  Diagnosis Date  . Kidney stones     Patient Active Problem List   Diagnosis Date Noted  . Nephrolithiasis 03/26/2016    Past Surgical History:  Procedure Laterality Date  . DENTAL SURGERY    . LITHOTRIPSY          Home Medications    Prior to Admission medications   Not on File    Family History Family History  Problem Relation Age of Onset  . Healthy Mother     Social History Social History   Tobacco Use  . Smoking status: Never Smoker  . Smokeless tobacco: Never Used  Substance Use Topics  . Alcohol use: No  . Drug use: No     Allergies   Patient has no known allergies.   Review of Systems Review of Systems  Genitourinary: Positive for flank pain.  All other systems reviewed and are negative.    Physical Exam Updated Vital Signs Ht 5\' 10"  (1.778 m)   Wt 72.6 kg   BMI 22.96 kg/m   Physical Exam Vitals signs and nursing note reviewed.  Constitutional:      Comments: Slightly uncomfortable   HENT:     Head: Normocephalic.     Right Ear: Tympanic membrane normal.     Left Ear: Tympanic membrane normal.     Nose: Nose normal.     Mouth/Throat:     Mouth: Mucous membranes are moist.  Eyes:     Pupils: Pupils are equal, round, and reactive  to light.  Neck:     Musculoskeletal: Normal range of motion.  Cardiovascular:     Rate and Rhythm: Normal rate and regular rhythm.  Pulmonary:     Effort: Pulmonary effort is normal.     Breath sounds: Normal breath sounds.  Abdominal:     General: Abdomen is flat.     Palpations: Abdomen is soft.     Comments: Mild L CVAT   Musculoskeletal: Normal range of motion.  Skin:    General: Skin is warm.     Capillary Refill: Capillary refill takes less than 2 seconds.  Neurological:     General: No focal deficit present.     Mental Status: He is alert and oriented to person, place, and time.  Psychiatric:        Mood and Affect: Mood normal.      ED Treatments / Results  Labs (all labs ordered are listed, but only abnormal results are displayed) Labs Reviewed  CBC WITH DIFFERENTIAL/PLATELET - Abnormal; Notable for the following components:      Result Value   MCV 78.5 (*)    MCH 25.8 (*)    All other components within normal limits  COMPREHENSIVE METABOLIC PANEL  URINALYSIS, ROUTINE W REFLEX MICROSCOPIC    EKG None  Radiology Ct Renal Stone Study  Result Date: 05/22/2018 CLINICAL DATA:  38 year old male with left flank pain, nausea, vomiting and hematuria EXAM: CT ABDOMEN AND PELVIS WITHOUT CONTRAST TECHNIQUE: Multidetector CT imaging of the abdomen and pelvis was performed following the standard protocol without IV contrast. COMPARISON:  Prior CT scan of the abdomen and pelvis 05/01/2014 FINDINGS: Lower chest: The lung bases are clear. Visualized cardiac structures are within normal limits for size. No pericardial effusion. Unremarkable visualized distal thoracic esophagus. Hepatobiliary: Normal hepatic contour and morphology. No discrete hepatic lesions. Normal appearance of the gallbladder. No intra or extrahepatic biliary ductal dilatation. Pancreas: Unremarkable. No pancreatic ductal dilatation or surrounding inflammatory changes. Spleen: Normal in size without focal  abnormality. Adrenals/Urinary Tract: Normal adrenal glands. Multifocal bilateral nephrolithiasis. There are at least 8 small stones in the right renal collecting system and 9 stones in the left renal collecting system. The largest individual stone in the left lower pole measures 6 mm. Mild left hydronephrosis. The left proximal ureter is also distended secondary to a 7 x 5 mm stone. The bladder is decompressed. Stomach/Bowel: No evidence of obstruction or focal bowel wall thickening. Normal appendix in the right lower quadrant. The terminal ileum is unremarkable. Vascular/Lymphatic: Limited evaluation in the absence of intravenous contrast. No aneurysm or significant atherosclerotic calcifications. No suspicious lymphadenopathy. Reproductive: Prostate is unremarkable. Other: No abdominal wall hernia or abnormality. No abdominopelvic ascites. Musculoskeletal: No acute or significant osseous findings. IMPRESSION: 1. Mild left hydronephrosis secondary to an obstructing 7 x 5 mm stone in the proximal ureter. 2. Multifocal nonobstructing nephrolithiasis bilaterally. Electronically Signed   By: Malachy Moan M.D.   On: 05/22/2018 14:07    Procedures Procedures (including critical care time)  Medications Ordered in ED Medications  sodium chloride 0.9 % bolus 1,000 mL (1,000 mLs Intravenous New Bag/Given 05/22/18 1339)  morphine 4 MG/ML injection 4 mg (0 mg Intravenous Hold 05/22/18 1335)  ketorolac (TORADOL) 30 MG/ML injection 30 mg (30 mg Intravenous Given 05/22/18 1343)     Initial Impression / Assessment and Plan / ED Course  I have reviewed the triage vital signs and the nursing notes.  Pertinent labs & imaging results that were available during my care of the patient were reviewed by me and considered in my medical decision making (see chart for details).     Alexander Ayala is a 38 y.o. male here with L CVAT. Has hx of previous kidney stone. Will get CT renal stone, labs, UA.   4:11 PM UA +  blood, ? UTI. CT showed mild hydro with L proximal 7 mm stone. I talked to Dr. Marlou Porch from urology. Given that patient's pain is under control and didn't require narcotics in the ED, he will schedule patient for lithotripsy in 2 days. Given rocephin, will dc home with vicodin, keflex, zofran. Told him to be NPO after midnight on Sunday.    Final Clinical Impressions(s) / ED Diagnoses   Final diagnoses:  None    ED Discharge Orders    None       Charlynne Pander, MD 05/22/18 [phone]

## 2018-05-22 NOTE — Discharge Instructions (Addendum)
Take motrin 800 mg every 6 hrs for pain.   Your kidney function is slightly abnormal. Stay hydrated   Take vicodin for pain. Do NOT drive with it.   Take keflex three times daily for a week.   Dr. Marlou Porch should call you today or tomorrow. He wants to schedule you for lithotripsy on Monday. Don't eat or drink anything Sunday night   Return to ER if you have worse abdominal pain, vomiting, fever

## 2018-05-22 NOTE — ED Notes (Signed)
Patient verbalizes understanding of discharge instructions. Opportunity for questioning and answers were provided. Pt discharged from ED. 

## 2018-05-23 LAB — URINE CULTURE: Culture: NO GROWTH

## 2018-05-24 ENCOUNTER — Other Ambulatory Visit: Payer: Self-pay

## 2018-05-24 ENCOUNTER — Ambulatory Visit (HOSPITAL_COMMUNITY)
Admission: RE | Admit: 2018-05-24 | Discharge: 2018-05-24 | Disposition: A | Discharge status: Home / Self Care | Payer: Self-pay | Source: Ambulatory Visit | Attending: Urology | Admitting: Urology

## 2018-05-24 ENCOUNTER — Encounter (HOSPITAL_COMMUNITY): Payer: Self-pay | Admitting: *Deleted

## 2018-05-24 ENCOUNTER — Encounter (HOSPITAL_COMMUNITY)
Admission: RE | Disposition: A | Discharge status: Home / Self Care | Payer: Self-pay | Source: Ambulatory Visit | Attending: Urology

## 2018-05-24 ENCOUNTER — Ambulatory Visit (HOSPITAL_COMMUNITY): Payer: Self-pay

## 2018-05-24 DIAGNOSIS — N200 Calculus of kidney: Secondary | ICD-10-CM

## 2018-05-24 DIAGNOSIS — N132 Hydronephrosis with renal and ureteral calculous obstruction: Secondary | ICD-10-CM | POA: Insufficient documentation

## 2018-05-24 HISTORY — PX: EXTRACORPOREAL SHOCK WAVE LITHOTRIPSY: SHX1557

## 2018-05-24 SURGERY — LITHOTRIPSY, ESWL
Anesthesia: Moderate Sedation | Laterality: Left

## 2018-05-24 MED ORDER — DIAZEPAM 2 MG PO TABS
10.0000 mg | ORAL_TABLET | Freq: Once | ORAL | Status: AC
  Administered 2018-05-24: 10 mg via ORAL

## 2018-05-24 MED ORDER — TAMSULOSIN HCL 0.4 MG PO CAPS: 0.4000 mg | ORAL_CAPSULE | Freq: Every day | ORAL | 0 refills | Status: AC

## 2018-05-24 MED ORDER — DIPHENHYDRAMINE HCL 25 MG PO CAPS
25.0000 mg | ORAL_CAPSULE | Freq: Once | ORAL | Status: AC
  Administered 2018-05-24: 25 mg via ORAL
  Filled 2018-05-24: qty 1

## 2018-05-24 MED ORDER — DIAZEPAM 5 MG PO TABS
ORAL_TABLET | ORAL | Status: AC
  Filled 2018-05-24: qty 2

## 2018-05-24 MED ORDER — SODIUM CHLORIDE 0.9 % IV SOLN
INTRAVENOUS | Status: DC
  Administered 2018-05-24: 08:00:00 via INTRAVENOUS

## 2018-05-24 MED ORDER — CIPROFLOXACIN HCL 500 MG PO TABS
500.0000 mg | ORAL_TABLET | Freq: Once | ORAL | Status: AC
  Administered 2018-05-24: 500 mg via ORAL
  Filled 2018-05-24 (×2): qty 1

## 2018-05-24 NOTE — Discharge Instructions (Signed)
See [facility] discharge instructions in chart.  

## 2018-05-24 NOTE — H&P (Signed)
Cc: Left flank pain  History of present illness: 5M with history of nephrolithiasis.  He presented to the ED on Saturday after 2 days of ongoing flank pain and associated nausea and vomitting.  He denies any fevers/chills.  He was also not having any hematuria.  In the ED he was noted to have a 53mm stone in the left proximal ureter.  His UA was somewhat suspect but he denies any dysuria.  He was discharged home with vicodin, keflex, and zofran.  I spoke with him over the phone as he was being discharged and discussed performing shockwave lithotripsy.  Review of systems: A 12 point comprehensive review of systems was obtained and is negative unless otherwise stated in the history of present illness.  Patient Active Problem List   Diagnosis Date Noted  . Nephrolithiasis 03/26/2016    No current facility-administered medications on file prior to encounter.    Current Outpatient Medications on File Prior to Encounter  Medication Sig Dispense Refill  . cephALEXin (KEFLEX) 500 MG capsule Take 1 capsule (500 mg total) by mouth 3 (three) times daily. 20 capsule 0  . ondansetron (ZOFRAN ODT) 4 MG disintegrating tablet 4mg  ODT q4 hours prn nausea/vomit 10 tablet 0  . oxyCODONE-acetaminophen (PERCOCET) 5-325 MG tablet Take 1 tablet by mouth every 6 (six) hours as needed. 10 tablet 0    Past Medical History:  Diagnosis Date  . Kidney stones     Past Surgical History:  Procedure Laterality Date  . DENTAL SURGERY    . LITHOTRIPSY      Social History   Tobacco Use  . Smoking status: Never Smoker  . Smokeless tobacco: Never Used  Substance Use Topics  . Alcohol use: No  . Drug use: No    Family History  Problem Relation Age of Onset  . Healthy Mother     PE: Vitals:   05/24/18 0656  BP: (!) 143/80  Pulse: 86  Resp: 18  Temp: 98.5 F (36.9 C)  TempSrc: Oral  SpO2: 100%  Weight: 69.1 kg  Height: 5\' 10"  (1.778 m)   Patient appears to be in no acute distress  patient is  alert and oriented x3 Atraumatic normocephalic head No cervical or supraclavicular lymphadenopathy appreciated No increased work of breathing, no audible wheezes/rhonchi Regular sinus rhythm/rate Abdomen is soft, nontender, nondistended, left CVA tenderness. Lower extremities are symmetric without appreciable edema Grossly neurologically intact No identifiable skin lesions  Recent Labs    05/22/18 1334  WBC 9.9  HGB 14.2  HCT 43.2   Recent Labs    05/22/18 1334  NA 133*  K 3.9  CL 96*  CO2 22  GLUCOSE 94  BUN 10  CREATININE 1.66*  CALCIUM 9.4   No results for input(s): LABPT, INR in the last 72 hours. No results for input(s): LABURIN in the last 72 hours. Results for orders placed or performed during the hospital encounter of 05/22/18  Urine culture     Status: None   Collection Time: 05/22/18  4:31 PM  Result Value Ref Range Status   Specimen Description URINE, RANDOM  Final   Special Requests   Final    NONE Performed at [facility], [address], [place], [place] [address]    Culture NO GROWTH  Final   Report Status 05/23/2018 FINAL  Final    Imaging: I have reviewed the patient's CT scan and discussed it with the patient.  He has a 38mm stone in the proximal  ureter with hydronephrosis.  Imp:  Left proximal stone with associated renal colic symptoms  Recommendations: Left ESWL. We discussed the procedure including the risks and expected benefits.  Patient understands that he may require additional procedures and the risk of hematuria/hematoma.  He has had this before and did well.  He would like to proceed.   Crist Fat

## 2018-05-24 NOTE — Op Note (Signed)
See [facility] OP note scanned into chart. Also because of the size, density, location and other factors that cannot be anticipated I feel this will likely be a staged procedure. This fact supersedes any indication in the scanned [facility] operative note to the contrary.  

## 2018-05-25 ENCOUNTER — Encounter (HOSPITAL_COMMUNITY): Payer: Self-pay | Admitting: Urology
